# Patient Record
Sex: Male | Born: 1955
Health system: Southern US, Community
[De-identification: ages and names within clinical notes are randomized; demographics above are authoritative.]

## PROBLEM LIST (undated history)

## (undated) DIAGNOSIS — I10 Essential (primary) hypertension: Secondary | ICD-10-CM

---

## 2020-08-03 ENCOUNTER — Encounter (HOSPITAL_COMMUNITY): Payer: Self-pay | Admitting: Pulmonary Disease

## 2020-08-03 ENCOUNTER — Inpatient Hospital Stay (HOSPITAL_COMMUNITY): Payer: BC Managed Care – PPO

## 2020-08-03 ENCOUNTER — Inpatient Hospital Stay (HOSPITAL_COMMUNITY)
Admission: AD | Admit: 2020-08-03 | Discharge: 2020-08-18 | DRG: 871 | Disposition: E | Payer: BC Managed Care – PPO | Source: Other Acute Inpatient Hospital | Attending: Pulmonary Disease | Admitting: Pulmonary Disease

## 2020-08-03 DIAGNOSIS — E872 Acidosis: Secondary | ICD-10-CM | POA: Diagnosis present

## 2020-08-03 DIAGNOSIS — K7200 Acute and subacute hepatic failure without coma: Secondary | ICD-10-CM | POA: Diagnosis present

## 2020-08-03 DIAGNOSIS — R6521 Severe sepsis with septic shock: Secondary | ICD-10-CM | POA: Diagnosis present

## 2020-08-03 DIAGNOSIS — E162 Hypoglycemia, unspecified: Secondary | ICD-10-CM | POA: Diagnosis present

## 2020-08-03 DIAGNOSIS — Z4659 Encounter for fitting and adjustment of other gastrointestinal appliance and device: Secondary | ICD-10-CM

## 2020-08-03 DIAGNOSIS — J189 Pneumonia, unspecified organism: Secondary | ICD-10-CM | POA: Diagnosis present

## 2020-08-03 DIAGNOSIS — I469 Cardiac arrest, cause unspecified: Secondary | ICD-10-CM | POA: Diagnosis present

## 2020-08-03 DIAGNOSIS — R159 Full incontinence of feces: Secondary | ICD-10-CM | POA: Diagnosis present

## 2020-08-03 DIAGNOSIS — J9601 Acute respiratory failure with hypoxia: Secondary | ICD-10-CM | POA: Diagnosis present

## 2020-08-03 DIAGNOSIS — I119 Hypertensive heart disease without heart failure: Secondary | ICD-10-CM | POA: Diagnosis present

## 2020-08-03 DIAGNOSIS — N179 Acute kidney failure, unspecified: Secondary | ICD-10-CM | POA: Diagnosis present

## 2020-08-03 DIAGNOSIS — K567 Ileus, unspecified: Secondary | ICD-10-CM | POA: Diagnosis present

## 2020-08-03 DIAGNOSIS — Z66 Do not resuscitate: Secondary | ICD-10-CM | POA: Diagnosis present

## 2020-08-03 DIAGNOSIS — I4891 Unspecified atrial fibrillation: Secondary | ICD-10-CM | POA: Diagnosis present

## 2020-08-03 DIAGNOSIS — K7460 Unspecified cirrhosis of liver: Secondary | ICD-10-CM | POA: Diagnosis present

## 2020-08-03 DIAGNOSIS — J690 Pneumonitis due to inhalation of food and vomit: Secondary | ICD-10-CM | POA: Diagnosis present

## 2020-08-03 DIAGNOSIS — D689 Coagulation defect, unspecified: Secondary | ICD-10-CM | POA: Diagnosis present

## 2020-08-03 DIAGNOSIS — M5430 Sciatica, unspecified side: Secondary | ICD-10-CM | POA: Diagnosis present

## 2020-08-03 DIAGNOSIS — M6282 Rhabdomyolysis: Secondary | ICD-10-CM | POA: Diagnosis present

## 2020-08-03 DIAGNOSIS — D696 Thrombocytopenia, unspecified: Secondary | ICD-10-CM | POA: Diagnosis present

## 2020-08-03 DIAGNOSIS — D45 Polycythemia vera: Secondary | ICD-10-CM | POA: Diagnosis present

## 2020-08-03 DIAGNOSIS — A401 Sepsis due to streptococcus, group B: Principal | ICD-10-CM | POA: Diagnosis present

## 2020-08-03 DIAGNOSIS — G9341 Metabolic encephalopathy: Secondary | ICD-10-CM | POA: Diagnosis present

## 2020-08-03 DIAGNOSIS — I468 Cardiac arrest due to other underlying condition: Secondary | ICD-10-CM | POA: Diagnosis present

## 2020-08-03 DIAGNOSIS — R32 Unspecified urinary incontinence: Secondary | ICD-10-CM | POA: Diagnosis present

## 2020-08-03 HISTORY — DX: Essential (primary) hypertension: I10

## 2020-08-03 LAB — GLUCOSE, CAPILLARY
Glucose-Capillary: 85 mg/dL (ref 70–99)
Glucose-Capillary: 48 mg/dL — ABNORMAL LOW (ref 70–99)
Glucose-Capillary: 66 mg/dL — ABNORMAL LOW (ref 70–99)
Glucose-Capillary: 36 mg/dL — CL (ref 70–99)
Glucose-Capillary: 131 mg/dL — ABNORMAL HIGH (ref 70–99)
Glucose-Capillary: 79 mg/dL (ref 70–99)

## 2020-08-03 LAB — CBC WITH DIFFERENTIAL/PLATELET
Abs Immature Granulocytes: 5.64 10*3/uL — ABNORMAL HIGH (ref 0.00–0.07)
Basophils Absolute: 0.4 10*3/uL — ABNORMAL HIGH (ref 0.0–0.1)
Basophils Relative: 1 %
Eosinophils Absolute: 0 10*3/uL (ref 0.0–0.5)
Eosinophils Relative: 0 %
HCT: 55.3 % — ABNORMAL HIGH (ref 39.0–52.0)
Hemoglobin: 15.6 g/dL (ref 13.0–17.0)
Immature Granulocytes: 9 %
Lymphocytes Relative: 5 %
Lymphs Abs: 2.8 10*3/uL (ref 0.7–4.0)
MCH: 25.1 pg — ABNORMAL LOW (ref 26.0–34.0)
MCHC: 28.2 g/dL — ABNORMAL LOW (ref 30.0–36.0)
MCV: 89 fL (ref 80.0–100.0)
Monocytes Absolute: 13.7 10*3/uL — ABNORMAL HIGH (ref 0.1–1.0)
Monocytes Relative: 23 %
Neutro Abs: 37.7 10*3/uL — ABNORMAL HIGH (ref 1.7–7.7)
Neutrophils Relative %: 62 %
Platelets: 77 10*3/uL — ABNORMAL LOW (ref 150–400)
RBC: 6.21 MIL/uL — ABNORMAL HIGH (ref 4.22–5.81)
RDW: 29.8 % — ABNORMAL HIGH (ref 11.5–15.5)
WBC: 60.2 10*3/uL (ref 4.0–10.5)
nRBC: 7.8 % — ABNORMAL HIGH (ref 0.0–0.2)

## 2020-08-03 LAB — LACTIC ACID, PLASMA
Lactic Acid, Venous: >11 mmol/L (ref 0.5–1.9)
Lactic Acid, Venous: >11 mmol/L (ref 0.5–1.9)

## 2020-08-03 LAB — COMPREHENSIVE METABOLIC PANEL
ALT: 793 U/L — ABNORMAL HIGH (ref 0–44)
AST: 4191 U/L — ABNORMAL HIGH (ref 15–41)
Albumin: 1.7 g/dL — ABNORMAL LOW (ref 3.5–5.0)
Alkaline Phosphatase: 619 U/L — ABNORMAL HIGH (ref 38–126)
Anion gap: 29 — ABNORMAL HIGH (ref 5–15)
BUN: 74 mg/dL — ABNORMAL HIGH (ref 8–23)
CO2: 12 mmol/L — ABNORMAL LOW (ref 22–32)
Calcium: 5.8 mg/dL — CL (ref 8.9–10.3)
Chloride: 101 mmol/L (ref 98–111)
Creatinine, Ser: 3.72 mg/dL — ABNORMAL HIGH (ref 0.61–1.24)
GFR, Estimated: 17 mL/min — ABNORMAL LOW (ref >60)
Glucose, Bld: 122 mg/dL — ABNORMAL HIGH (ref 70–99)
Potassium: 6.5 mmol/L (ref 3.5–5.1)
Sodium: 142 mmol/L (ref 135–145)
Total Bilirubin: 6.9 mg/dL — ABNORMAL HIGH (ref 0.3–1.2)
Total Protein: 4.1 g/dL — ABNORMAL LOW (ref 6.5–8.1)

## 2020-08-03 LAB — CK: Total CK: 19801 U/L — ABNORMAL HIGH (ref 49–397)

## 2020-08-03 LAB — PROCALCITONIN: Procalcitonin: 33.89 ng/mL

## 2020-08-03 LAB — MAGNESIUM: Magnesium: 3.5 mg/dL — ABNORMAL HIGH (ref 1.7–2.4)

## 2020-08-03 LAB — BLOOD GAS, ARTERIAL
Acid-base deficit: 21.6 mmol/L — ABNORMAL HIGH (ref 0.0–2.0)
Bicarbonate: 9.1 mmol/L — ABNORMAL LOW (ref 20.0–28.0)
O2 Saturation: 93.9 %
Patient temperature: 98.6
pCO2 arterial: 34.5 mmHg (ref 32.0–48.0)
pH, Arterial: 7.048 — CL (ref 7.350–7.450)
pO2, Arterial: 92.3 mmHg (ref 83.0–108.0)

## 2020-08-03 LAB — HEMOGLOBIN A1C
Hgb A1c MFr Bld: 4.6 % — ABNORMAL LOW (ref 4.8–5.6)
Mean Plasma Glucose: 85.32 mg/dL

## 2020-08-03 LAB — MRSA PCR SCREENING: MRSA by PCR: NEGATIVE

## 2020-08-03 MED ORDER — SODIUM CHLORIDE 0.9 % IV SOLN
2.0000 g | INTRAVENOUS | Status: DC
  Administered 2020-08-03: 2 g via INTRAVENOUS
  Filled 2020-08-03: qty 20

## 2020-08-03 MED ORDER — DEXTROSE 50 % IV SOLN
INTRAVENOUS | Status: AC
  Filled 2020-08-03: qty 50

## 2020-08-03 MED ORDER — CALCIUM GLUCONATE-NACL 1-0.675 GM/50ML-% IV SOLN
1.0000 g | Freq: Once | INTRAVENOUS | Status: AC
  Administered 2020-08-03: 1000 mg via INTRAVENOUS
  Filled 2020-08-03: qty 50

## 2020-08-03 MED ORDER — SODIUM ZIRCONIUM CYCLOSILICATE 10 G PO PACK
10.0000 g | PACK | Freq: Two times a day (BID) | ORAL | Status: DC
  Administered 2020-08-03: 10 g
  Filled 2020-08-03: qty 1

## 2020-08-03 MED ORDER — ORAL CARE MOUTH RINSE: 15.0000 mL | OROMUCOSAL | Status: DC

## 2020-08-03 MED ORDER — SODIUM CHLORIDE 0.9 % IV SOLN
INTRAVENOUS | Status: DC | PRN
  Administered 2020-08-03: 250 mL via INTRAVENOUS

## 2020-08-03 MED ORDER — VASOPRESSIN 20 UNITS/100 ML INFUSION FOR SHOCK
0.0000 [IU]/min | INTRAVENOUS | Status: DC
  Administered 2020-08-03 (×2): 0.03 [IU]/min via INTRAVENOUS
  Filled 2020-08-03 (×2): qty 100

## 2020-08-03 MED ORDER — POLYETHYLENE GLYCOL 3350 17 G PO PACK: 17.0000 g | PACK | Freq: Every day | ORAL | Status: DC | PRN

## 2020-08-03 MED ORDER — NOREPINEPHRINE 16 MG/250ML-% IV SOLN
0.0000 ug/min | INTRAVENOUS | Status: DC
  Administered 2020-08-03 (×2): 40 ug/min via INTRAVENOUS
  Filled 2020-08-03: qty 250

## 2020-08-03 MED ORDER — INSULIN ASPART 100 UNIT/ML ~~LOC~~ SOLN: 0.0000 [IU] | SUBCUTANEOUS | Status: DC

## 2020-08-03 MED ORDER — DOPAMINE-DEXTROSE 3.2-5 MG/ML-% IV SOLN
0.0000 ug/kg/min | INTRAVENOUS | Status: DC
  Administered 2020-08-03 (×3): 20 ug/kg/min via INTRAVENOUS
  Filled 2020-08-03: qty 250

## 2020-08-03 MED ORDER — ALBUTEROL SULFATE (2.5 MG/3ML) 0.083% IN NEBU: 2.5000 mg | INHALATION_SOLUTION | RESPIRATORY_TRACT | Status: DC | PRN

## 2020-08-03 MED ORDER — DEXTROSE 50 % IV SOLN
INTRAVENOUS | Status: AC
  Administered 2020-08-03: 50 mL
  Filled 2020-08-03: qty 50

## 2020-08-03 MED ORDER — ONDANSETRON HCL 4 MG/2ML IJ SOLN: 4.0000 mg | Freq: Four times a day (QID) | INTRAMUSCULAR | Status: DC | PRN

## 2020-08-03 MED ORDER — CHLORHEXIDINE GLUCONATE 0.12 % MT SOLN
OROMUCOSAL | Status: AC
  Administered 2020-08-03: 15 mL via OROMUCOSAL
  Filled 2020-08-03: qty 15

## 2020-08-03 MED ORDER — CHLORHEXIDINE GLUCONATE 0.12% ORAL RINSE (MEDLINE KIT): 15.0000 mL | Freq: Two times a day (BID) | OROMUCOSAL | Status: DC

## 2020-08-03 MED ORDER — ACETAMINOPHEN 325 MG PO TABS: 650.0000 mg | ORAL_TABLET | ORAL | Status: DC | PRN

## 2020-08-03 MED ORDER — PANTOPRAZOLE SODIUM 40 MG PO PACK
40.0000 mg | PACK | Freq: Every day | ORAL | Status: DC
  Administered 2020-08-03: 40 mg
  Filled 2020-08-03: qty 20

## 2020-08-03 MED ORDER — LACTATED RINGERS IV BOLUS
1000.0000 mL | Freq: Once | INTRAVENOUS | Status: AC
  Administered 2020-08-03: 1000 mL via INTRAVENOUS

## 2020-08-03 MED ORDER — EPINEPHRINE HCL 5 MG/250ML IV SOLN IN NS
0.5000 ug/min | INTRAVENOUS | Status: DC
  Administered 2020-08-03 (×4): 20 ug/min via INTRAVENOUS
  Filled 2020-08-03: qty 250

## 2020-08-03 MED ORDER — SODIUM BICARBONATE 8.4 % IV SOLN
INTRAVENOUS | Status: DC
  Filled 2020-08-03 (×2): qty 150
  Filled 2020-08-03: qty 850
  Filled 2020-08-03: qty 150

## 2020-08-04 ENCOUNTER — Other Ambulatory Visit (HOSPITAL_COMMUNITY): Payer: Self-pay | Admitting: Radiology

## 2020-08-04 ENCOUNTER — Ambulatory Visit (HOSPITAL_COMMUNITY): Payer: BC Managed Care – PPO

## 2020-08-04 LAB — HIV ANTIBODY (ROUTINE TESTING W REFLEX): HIV Screen 4th Generation wRfx: NONREACTIVE

## 2020-08-04 LAB — GLUCOSE, CAPILLARY: Glucose-Capillary: 66 mg/dL — ABNORMAL LOW (ref 70–99)

## 2020-08-04 NOTE — Discharge Summary (Signed)
Physician Death Summary  Patient ID: Righteous Claiborne MRN: [id number] DOB/AGE: 03/14/1956 64 y.o.  Admit date: 08/30/20 Discharge date: 30-Aug-2020  Admission Diagnoses: Cardiac arrest, septic shock  Discharge Diagnoses:  Active Problems:   Cardiac arrest ([place]) Septic shock GPC bacteremia Multiorgan failure Metabolic acidosis Lactic acidosis AKI Fulminant liver failure Cirrhosis  Discharged Condition: Deceased  Hospital Course:  64 y/o male with unknown medical history transferred to [facility] on 11/16 from [facility] after being found down at home.    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  He had reportedly not been seen for two days.  Police activated for welfare check and he was found on the floor in front of an open window and the door was open.  Initial notes from ER show he was "semi responsive with a glucose of 44.  He was moaning when touched".  Treated with glucose and CBG increased to 57.  He was incontinent of urine and stool.  Per flight team, he reportedly "coded multiple times in the ER, ETT had to be exchanged per anesthesia and his pressures were in the 92'H -57'M systolic for most of the night".  Most recent labs from [facility] - ABG 7.0 / 34 / 9.9 / 90.8, WBC 68k, platelets 85, neutrophils 41, PCT 87, Ca 7.5, albumin 1.2, alk phos 325, Bili 4.2, Mg 4.1, BUN 86, Sr Cr 2.52, AG 17, CPK 3,415, PT 22.8, PTT 80.  Blood cultures 2/2 showed GPC's.  UDS & ETOH negative. He was treated with flagyl, cefepime and vancomycin.  He was given 10 mg Vitamin K. Sodium bicarbonate, dopamine, levophed and epinephrine gtt's initiated. He developed respiratory failure around noon on 11/15 and was intubated.  During process, he suffered a cardiac arrest. Initial resuscitation efforts lasted from 1328 to 1342.  He suffered a second arrest at 1400 lasting until 1408.   On arrival, the patient was found to be on 4 max dose vasopressors,  intubated with a systolic BP in the 73'U. [place], Fiance who lives in [place] was contacted and agreed for DNR status.  No other family is available.  CRRT not initiated due to severe hemodynamic instability. He continued to deteriorate through the day and had a cardiac arrest again later that night and passed away.   SignedMarshell Garfinkel 08/04/2020, 3:39 PM

## 2020-08-08 LAB — CULTURE, BLOOD (ROUTINE X 2)
Culture: NO GROWTH
Culture: NO GROWTH

## 2020-08-18 NOTE — Progress Notes (Addendum)
Hypoglycemic Event  CBG: 36  Treatment: 25g D50  Symptoms: none  Follow-up CBG: Time: 1838    CBG Result: 131  Possible Reasons for Event: NPO   Comments/MD notified: Mannam, MD- verbal order to change bicarb/D5 to 175cc/hr    Ammon Muscatello E Cleaster Shiffer

## 2020-08-18 NOTE — Procedures (Addendum)
Arterial Catheter Insertion Procedure Note Garrett Church [id number] 05/27/1956  Procedure: Insertion of Arterial Catheter  Indications: Blood pressure monitoring and Frequent blood sampling  Procedure Details Consent: Unable to obtain consent because of emergent medical necessity.  Unable to reach any family / contacts prior to procedure.    Time Out: Verified patient identification, verified procedure, site/side was marked, verified correct patient position, special equipment/implants available, medications/allergies/relevent history reviewed, required imaging and test results available.  Performed  Maximum sterile technique was used including antiseptics, cap, gloves, gown, hand hygiene, mask and sheet. Skin prep: Chlorhexidine; local anesthetic administered 20 gauge catheter was inserted into right femoral artery using the Seldinger technique.  Evaluation Blood flow good; BP tracing good. Complications: No apparent complications.   Garrett Gens, MSN, NP-C, AGACNP-BC [facility] 08/04/2020, 8:12 AM   Please see [email or web address] for pager details.

## 2020-08-18 NOTE — Progress Notes (Signed)
Hypoglycemic Event  CBG: 48  Treatment: 25g D50  Symptoms: none  Follow-up CBG: Time: 1723  CBG Result: 66  Possible Reasons for Event: NPO  Comments/MD notified: [name], NP     [place]

## 2020-08-18 NOTE — Progress Notes (Signed)
Patient was direct admit from [facility]. Patient currently was on 9 mcg of epinephrine, dopamine 35mcg, 62mcg of Levophed, and sodium bicarb at 141ml/hr. Patient blood pressure in the upper 79Z systolic. Patient on ventilator 100% Fio2, PEEP of 5. Increased levophed to 55mcg and epinephrine at 61mcg, MD Mannam ordered to give 1 liter LR bolus.

## 2020-08-18 NOTE — Progress Notes (Addendum)
Pt unresponsive and apneic.  Unable to palpate carotid pulse.  DNR status confirmed.  Asystole on monitor and confirmed in two leads.  No breath sounds auscultated for full minute.  No heart sounds auscultated for full minute.  Pupils fixed and dilated.  Confirmed by second registered nurse. Patient pronounced dead at:  2306-12-25

## 2020-08-18 NOTE — Progress Notes (Signed)
RT NOTE:  RT unable to obtain sputum specimen at this time even with x2 attempt with lavage. CCMD and RN made aware. RT will continue to monitor.

## 2020-08-18 NOTE — Progress Notes (Signed)
Pharmacy Antibiotic Note  Garrett Church is a 64 y.o. male admitted on 2020/08/13 with bacteremia.  Pharmacy has been consulted for ceftriaxone dosing.  Transfer from [facility].  He was on Vancomycin/cefepime/metronidazole.  Per phone conversation w/ [facility] 2/2 BCx positive for group B strep.  MD aware of PCN allergy. Pt intubated on epi drip. OK to give cephalosporin  Plan: Ceftriaxone 2 gm IV q24  Height: 6' (182.9 cm) Weight: 117 kg (257 lb 15 oz) IBW/kg (Calculated) : 77.6  No data recorded.  Recent Labs  Lab 13-Aug-2020 1339  WBC 60.2*    CrCl cannot be calculated (No successful lab value found.).    Allergies  Allergen Reactions  . Penicillins     Antimicrobials this admission: 11/15 Vanc >>11/6 ( 2 gm given 11/15 @ 1300 & 11/6 @ 2 am) 11/15 cefepime>> 11/6 ( 1 gm given 11/15 at 140 & 11/16 at 2 am) 11/15 metronidazole>> 11/16 ( 3 doses given) 11/16 ceftriaxone>> Dose adjustments this admission:  Microbiology results: 11/15 BCx L arm: group B strep 11/15 BCx R arm: group B strep  Thank you for allowing pharmacy to be a part of this patient's care.  Eudelia Bunch, Pharm.D 08/13/20 3:11 PM

## 2020-08-18 NOTE — Progress Notes (Signed)
Hypoglycemic Event  CBG: 66  Treatment: 25g D50  Symptoms: none  Follow-up CBG: Time: 1812 CBG Result: 36  Possible Reasons for Event: NPO  Comments/MD notified: Mannam, MD    [place]

## 2020-08-18 NOTE — Progress Notes (Signed)
Labs returned & reviewed.    Plan: Lokelma PT  LR Bolus 1L Calcium gluconate 1gm IV  Continue bicarbonate infusion     Called Garrett Church for second time, was able to reach her at [phone].  She identifies herself as Garrett Church's fiancee.  She lives in [place].  She reports he was supposed to retire in 6 months and then move to [place] to be with her. She was unable to reach him for several days and called police for a wellness check. She states he has no other family.    PMH per Garrett Church  -Cirrhosis of Liver.  Never drinks, non-smoker.   -last 2 weeks he felt sick > was not able to eat, was told he had the flu by his MD and was taken out of work but felt bad for 2 weeks per fiancee, was recently seen in ER and told he had food poisoning (seen in ER on Nov 4th).  He fell at work, possible broken wrist recently. Nov 7th - told fiancee he felt bad.  Nov 8th seen about wrist > told a nerve contusion.  At one point "was told he had cancer" and that he would not live for 2 years (that was 4 years ago). Garrett Church reported he has no stove or running refrigerator.    Reviewed current critical illness and concerns that he may not survive admission. Garrett shares that his mother died of cancer and he stated that he would not want to suffer.  I recommended continuing current support but no further CPR in the event of arrest. Garrett is in agreement. Will place DNR.  Reviewed that we will contact her with changes.     Garrett Gens, MSN, NP-C, AGACNP-BC [facility] Aug 22, 2020, 4:00 PM   Please see [email or web address] for pager details.

## 2020-08-18 NOTE — Progress Notes (Signed)
CRITICAL VALUE ALERT  Critical Value:  WBC 60.2  Date & Time Notied:  08-28-2020 1450  Provider Notified: Dr. Vaughan Browner   Orders Received/Actions taken: none

## 2020-08-18 NOTE — Progress Notes (Addendum)
CRITICAL VALUE ALERT  Critical Value:  K+ 6.5, Calcium 5.8, Lactic Acid >11  Date & Time Notied:  2020/08/09 1516  Provider Notified: [name], NP   Orders Received/Actions taken: New orders for lokelma, calcium gluconate, LR bolus

## 2020-08-18 NOTE — H&P (Addendum)
NAME:  Garrett Church, MRN:  [id number], DOB:  1956/09/05, LOS: 0 ADMISSION DATE:  2020-08-08, CONSULTATION DATE:  11/16 REFERRING MD:  [employer], CHIEF COMPLAINT:  Cardiac Arrest    Brief History   64 y/o M admitted 11/15 after being found down at home for unknown period of time.  Suffered multiple cardiac arrests in the ER at [facility].  Tx to [facility] for evaluation 11/16 on 4 vasopressors, intubated with SBP 50-60.   History of present illness   64 y/o male with unknown medical history transferred to [facility] on 11/16 from [facility] after being found down at home.    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  He had reportedly not been seen for two days.  Police activated for welfare check and he was found on the floor in front of an open window and the door was open.  Initial notes from ER show he was "semi responsive with a glucose of 44.  He was moaning when touched".  Treated with glucose and CBG increased to 57.  He was incontinent of urine and stool.  Per flight team, he reportedly "coded multiple times in the ER, ETT had to be exchanged per anesthesia and his pressures were in the 56'O -13'Y systolic for most of the night".  Most recent labs from [facility] - ABG 7.0 / 34 / 9.9 / 90.8, WBC 68k, platelets 85, neutrophils 41, PCT 87, Ca 7.5, albumin 1.2, alk phos 325, Bili 4.2, Mg 4.1, BUN 86, Sr Cr 2.52, AG 17, CPK 3,415, PT 22.8, PTT 80.  Blood cultures 2/2 showed GPC's.  UDS & ETOH negative. He was treated with flagyl, cefepime and vancomycin.  He was given 10 mg Vitamin K. Sodium bicarbonate, dopamine, levophed and epinephrine gtt's initiated. He developed respiratory failure around noon on 11/15 and was intubated.  During process, he suffered a cardiac arrest. Initial resuscitation efforts lasted from 1328 to 1342.  He suffered a second arrest at 1400 lasting until 1408.   On arrival, the patient was found to be on 4 max dose  vasopressors, intubated with a systolic BP in the 86'V.  No family per outside hospital.   Past Medical History  Limited documented hx in chart - below collected from paper chart review from [place].  Cardiomegaly on CXR Prior HTN - was supposed to be on an ACE Lower Extremity Edema Polycythemia  Cirrhosis on CT imaging Herniated Disc with Sciatic Pain   Significant Hospital Events   11/15 Presented to [facility]  11/16 Transferred to [facility]  Consults:     Procedures:  ETT 11/15 >>  L [place] TLC 11/15 [facility]) >>  R Femoral ALine 11/16 >>   Significant Diagnostic Tests:  CT Head [facility]) 11/15 >> negative per read  CT ABD/[facility]) 11/15 >> mildly prominent loops of small bowel diffusely, without an abrupt transition suggesting a likely ileus and possible enteritis, slight pericholecystic stranding, no gallstones, relatively dense regions of atelectasis / consolidation within the lower lobes bilaterally L>R, cirrhosis and fatty liver infiltration of the liver  ECHO ([place]) 11/15 >> LVEF 64%, moderate LVH, normal diastolic dysfunction, LA mildly dilated, RV mildly dilated, mild mitral regurgitation  Micro Data:  COVID 11/15 (at [facility]) >> negative  Influenza 11/15 (at [facility]) >> negative  BCx2 11/15 [facility]) >> 2/2 with GPC's >>  UA 11/15 >> large leukocytes, WBC >50  BCx2 11/16 >>  Tracheal Aspirate 11/16 >>  UA  11/16 >>   Antimicrobials:  Vanco 11/16 >>  Cefepime 11/16 >>   Interim history/subjective:  As above  Objective   Blood pressure (!) 50/23, pulse (!) 113, resp. rate (!) 24, height 6' (1.829 m), weight 117 kg.    Vent Mode: PRVC FiO2 (%):  [100 %] 100 % Set Rate:  [24 bmp] 24 bmp Vt Set:  [620 mL] 620 mL PEEP:  [5 cmH20] 5 cmH20 Plateau Pressure:  [20 cmH20] 20 cmH20  No intake or output data in the 24 hours ending 2020-08-29 1532 Filed Weights   2020-08-29 1300  Weight: 117 kg    Examination: General: critically ill appearing adult male  on vent  HEENT: pale, livedo on head, pupils dilated (~6-65mm) and non-reactive to light Neuro: no sedation, no response to painful stimuli or voice, no corneal response, no gag or cough CV: s1s2 RRR, no m/r/g, mottling noted  PULM: non-labored on vent, lungs bilaterally clear anterior, diminished bases  GI: soft, bsx4 active  Extremities: warm/dry, no edema  Skin: no rashes or lesions  Resolved Hospital Problem list     Assessment & Plan:   Cardiac Arrest  Found down at home with hypoglycemia, unknown downtime but last seen 2 days before presentation. GPC bacteremia suspected based on blood cultures from [place].  Arrested in ER at [facility], reportedly multiple times.  -continue vasopressors for MAP goal >65  -assess repeat labs now, ABG -not a candidate for TTM  -goal normothermia / avoid fever  -hold sedation to allow for neuro exam  -place Aline for BP monitoring   Acute Hypoxic Respiratory Failure  Suspected Aspiration PNA  Note 3 attempts for intubation at [facility].  -PRVC 8cc/kg, rate 24 -follow up ABG  -assess CXR now and in am  -mental status precludes weaning  -PRN albuterol   Septic Shock in setting of Group B Strep Bacteremia  Leukocytosis  Blood cultures at [facility] with 2/2 bottles positive for GPC's  -rocephin as above  -follow repeat blood cultures  -will need to call [place] to follow up on lab results for blood cultures  -follow WBC, profound elevation raises question of acute presentation of sepsis vs malignancy   Atrial Fibrillation  -hold anticoagulation for now -rate control as able   Acute Metabolic Encephalopathy  Suspected anoxic component. Negative CT head at [facility].  -consider repeat CT imaging to assess for edema in am 11/7  -minimize sedating medications as able to allow for neuro exam > no sedation since arrival 11/15 to [facility]   AKI  Rhabdomyolysis  AG Acidosis  -sodium bicarbonate infusion at 159ml/hr in dextrose  -Trend BMP /  urinary output -Replace electrolytes as indicated -Avoid nephrotoxic agents, ensure adequate renal perfusion   ? Cirrhosis  Shock Liver  Elevated Bilirubin Coagulopathy  Thrombocytopenia  ? Hx Polycythemia Vera  ? Hx ETOH or NASH based on prior CT imaging at [facility]. No records in Epic or CareEverywhere -follow LFT's, CBC -SCD's for now until labs returned   Possible Ileus  -NPO  -Gastric tube to LIS   Hypoglycemia  CBG in 40's on presentation  -dextrose in sodium bicarbonate IV -follow CBG   Best practice:  Diet: NPO  Pain/Anxiety/Delirium protocol (if indicated): hold for now, will order PAD if evidence of neuro recovery VAP protocol (if indicated): In place  DVT prophylaxis: SCD's  GI prophylaxis: PPI  Glucose control: SSI  Mobility: as tolerated  Code Status: Full Code  Family Communication: Attempted to call number listed in chart for  Tammy [place] (left message for return call).  Called [employer] to see if they had other numbers - Omer Jack listed but number not in order.  Disposition: ICU.  Concerned for poor prognosis.   Labs   CBC: Recent Labs  Lab 09/01/20 1339  WBC 60.2*  NEUTROABS 37.7*  HGB 15.6  HCT 55.3*  MCV 89.0  PLT 77*    Basic Metabolic Panel: Recent Labs  Lab 09/01/20 1339  NA 142  K 6.5*  CL 101  CO2 12*  GLUCOSE 122*  BUN 74*  CREATININE 3.72*  CALCIUM 5.8*  MG 3.5*   GFR: Estimated Creatinine Clearance: 26.2 mL/min (A) (by C-G formula based on SCr of 3.72 mg/dL (H)). Recent Labs  Lab 01-Sep-2020 1339  WBC 60.2*  LATICACIDVEN >11.0*    Liver Function Tests: Recent Labs  Lab September 01, 2020 1339  AST PENDING  ALT 793*  ALKPHOS 619*  BILITOT 6.9*  PROT 4.1*  ALBUMIN 1.7*   No results for input(s): LIPASE, AMYLASE in the last 168 hours. No results for input(s): AMMONIA in the last 168 hours.  ABG    Component Value Date/Time   PHART 7.048 (LL) 09-01-2020 1343   PCO2ART 34.5 2020/09/01 1343   PO2ART 92.3 2020-09-01  1343   HCO3 9.1 (L) 2020-09-01 1343   ACIDBASEDEF 21.6 (H) 2020-09-01 1343   O2SAT 93.9 09/01/20 1343     Coagulation Profile: No results for input(s): INR, PROTIME in the last 168 hours.  Cardiac Enzymes: No results for input(s): CKTOTAL, CKMB, CKMBINDEX, TROPONINI in the last 168 hours.  HbA1C: No results found for: HGBA1C  CBG: Recent Labs  Lab 09-01-2020 1358  GLUCAP 85    Review of Systems:   Unable to complete as patient is altered on vent.   Past Medical History  He,  has a past medical history of Hypertension.   Surgical History   History reviewed. No pertinent surgical history.   Social History      Family History   His family history is not on file.   Allergies Allergies  Allergen Reactions  . Penicillins      Home Medications  Prior to Admission medications   Not on File     Critical care time: 48 minutes     [name], MSN, NP-C, AGACNP-BC [facility] 09-01-20, 3:32 PM   Please see [email or web address] for pager details.

## 2020-08-18 NOTE — Progress Notes (Signed)
CRITICAL VALUE ALERT  Critical Value:  PH 7.048  Date & Time Notied:  08/18/2020 1404  Provider Notified: [name], NP   Orders Received/Actions taken: follow plan of care

## 2020-08-18 NOTE — Progress Notes (Signed)
[phone]. No answer, voicemail left with [facility] ICU call back number.

## 2020-08-18 DEATH — deceased

## 2021-04-03 IMAGING — DX DG CHEST 1V PORT
1 series · 1 of 1 positions shown · non-contrast
Comparison: No prior.

CLINICAL DATA: Cardiac arrest.

EXAM:
PORTABLE CHEST 1 VIEW

[chest ap]
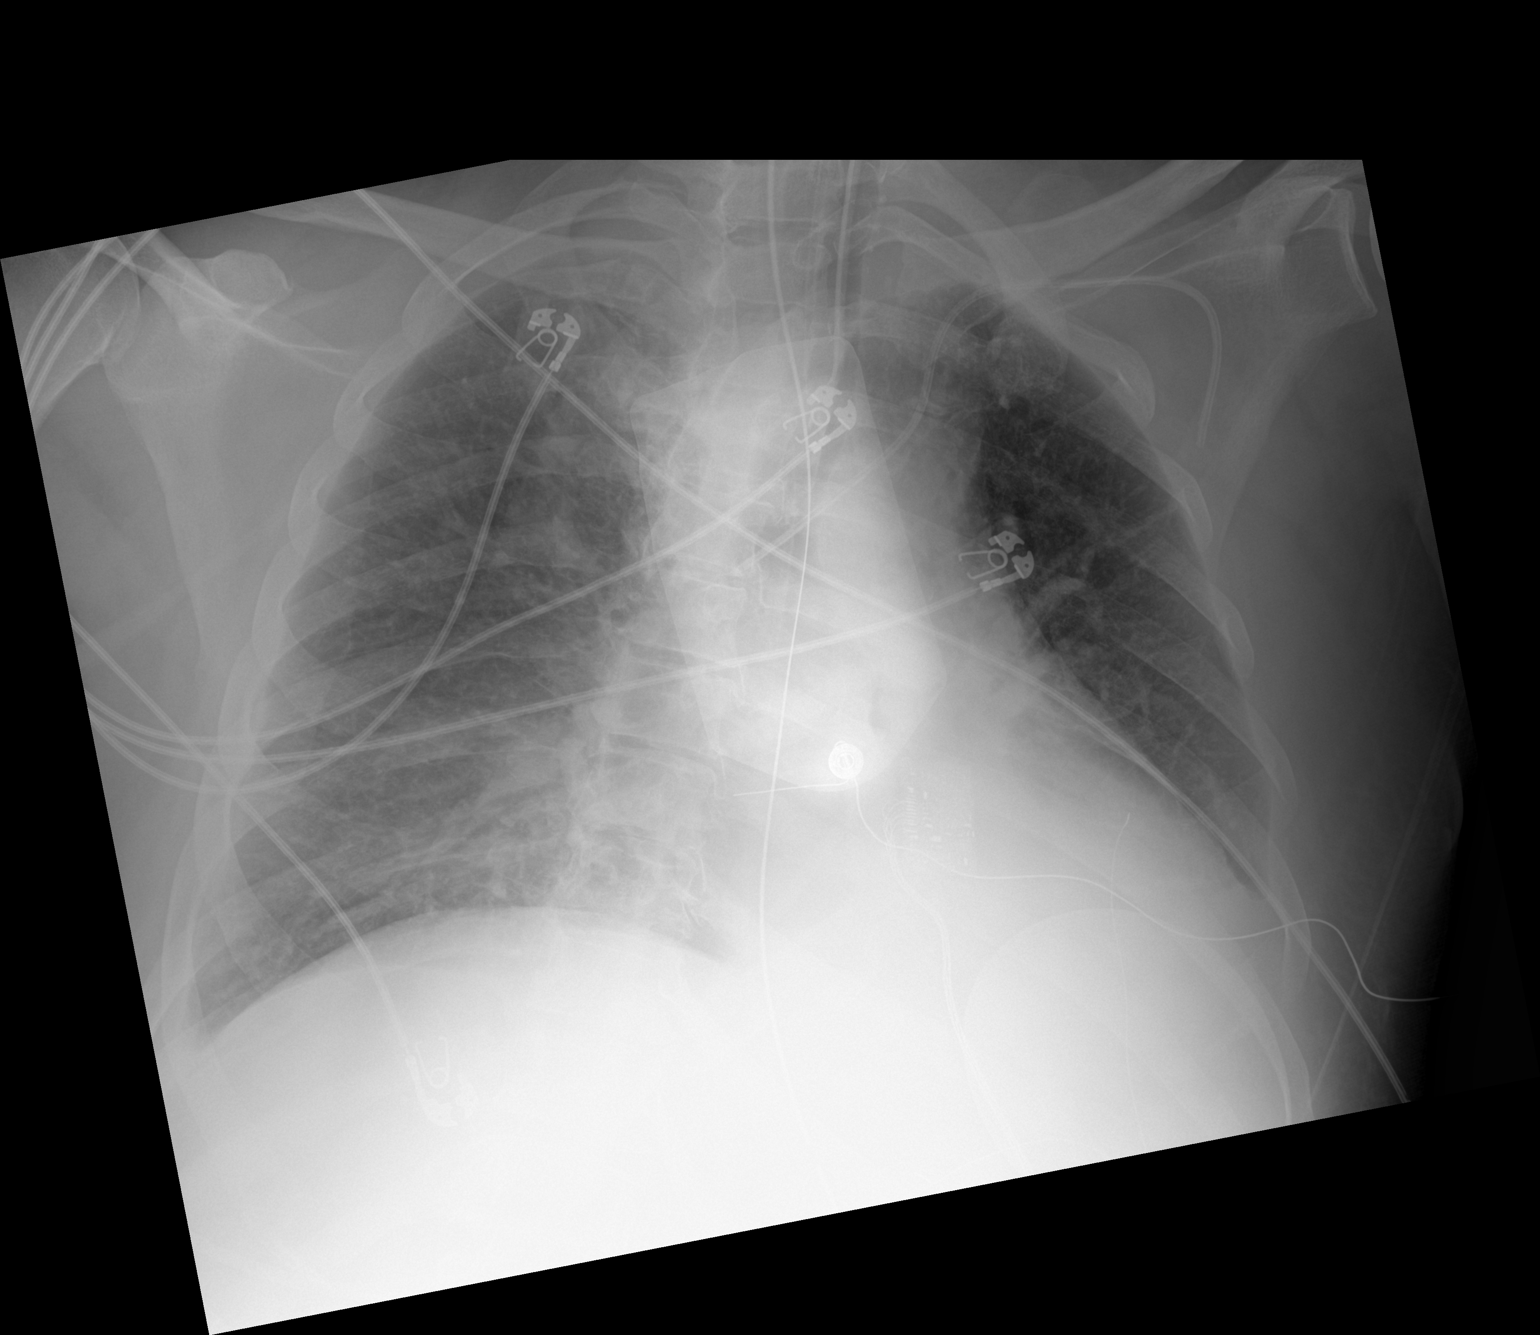

[1 of 1 positions shown; findings below may reference images not displayed]

FINDINGS: Patient rotated to the left. Endotracheal tube noted with its tip 4
cm above the carina. NG tube noted with its tip below left
hemidiaphragm. Central line noted with tip over SVC. Heart size
normal. Bilateral pulmonary infiltrates/edema and small bilateral
pleural effusions. Biapical pleural thickening noted, most
consistent scarring. No acute bony abnormality.
IMPRESSION: 1. Lines and tubes in good anatomic position.
2. Bilateral pulmonary infiltrates/edema and small bilateral pleural
effusions.
